# Patient Record
Sex: Male | Born: 1985 | Hispanic: No | Marital: Single | Smoking: Former smoker
Health system: Southern US, Community
[De-identification: ages and names within clinical notes are randomized; demographics above are authoritative.]

## PROBLEM LIST (undated history)

## (undated) HISTORY — PX: CHOLECYSTECTOMY: SHX55

---

## 2021-02-05 ENCOUNTER — Emergency Department: Payer: PRIVATE HEALTH INSURANCE

## 2021-02-05 ENCOUNTER — Ambulatory Visit
Admission: EM | Admit: 2021-02-05 | Discharge: 2021-02-05 | Disposition: A | Discharge status: Home / Self Care | Payer: PRIVATE HEALTH INSURANCE | Attending: Emergency Medicine | Admitting: Emergency Medicine

## 2021-02-05 ENCOUNTER — Emergency Department: Payer: PRIVATE HEALTH INSURANCE | Admitting: Anesthesiology

## 2021-02-05 ENCOUNTER — Encounter
Admission: EM | Disposition: A | Discharge status: Home / Self Care | Payer: Self-pay | Source: Home / Self Care | Attending: Emergency Medicine

## 2021-02-05 ENCOUNTER — Other Ambulatory Visit: Payer: Self-pay

## 2021-02-05 ENCOUNTER — Encounter: Payer: Self-pay | Admitting: Emergency Medicine

## 2021-02-05 DIAGNOSIS — R590 Localized enlarged lymph nodes: Secondary | ICD-10-CM | POA: Insufficient documentation

## 2021-02-05 DIAGNOSIS — Z20822 Contact with and (suspected) exposure to covid-19: Secondary | ICD-10-CM | POA: Insufficient documentation

## 2021-02-05 DIAGNOSIS — Z87891 Personal history of nicotine dependence: Secondary | ICD-10-CM | POA: Insufficient documentation

## 2021-02-05 DIAGNOSIS — J36 Peritonsillar abscess: Secondary | ICD-10-CM | POA: Insufficient documentation

## 2021-02-05 DIAGNOSIS — Z9049 Acquired absence of other specified parts of digestive tract: Secondary | ICD-10-CM | POA: Insufficient documentation

## 2021-02-05 HISTORY — PX: INCISION AND DRAINAGE OF PERITONSILLAR ABCESS: SHX6257

## 2021-02-05 LAB — LACTIC ACID, PLASMA
Lactic Acid, Venous: 1.6 mmol/L (ref 0.5–1.9)
Lactic Acid, Venous: 1.1 mmol/L (ref 0.5–1.9)

## 2021-02-05 LAB — APTT: aPTT: 31 seconds (ref 24–36)

## 2021-02-05 LAB — CBC WITH DIFFERENTIAL/PLATELET
Abs Immature Granulocytes: 0.12 10*3/uL — ABNORMAL HIGH (ref 0.00–0.07)
Basophils Absolute: 0.1 10*3/uL (ref 0.0–0.1)
Basophils Relative: 0 %
Eosinophils Absolute: 0.1 10*3/uL (ref 0.0–0.5)
Eosinophils Relative: 0 %
HCT: 45.6 % (ref 39.0–52.0)
Hemoglobin: 15.9 g/dL (ref 13.0–17.0)
Immature Granulocytes: 1 %
Lymphocytes Relative: 6 %
Lymphs Abs: 1.4 10*3/uL (ref 0.7–4.0)
MCH: 33.8 pg (ref 26.0–34.0)
MCHC: 34.9 g/dL (ref 30.0–36.0)
MCV: 97 fL (ref 80.0–100.0)
Monocytes Absolute: 1.7 10*3/uL — ABNORMAL HIGH (ref 0.1–1.0)
Monocytes Relative: 7 %
Neutro Abs: 19.8 10*3/uL — ABNORMAL HIGH (ref 1.7–7.7)
Neutrophils Relative %: 86 %
Platelets: 315 10*3/uL (ref 150–400)
RBC: 4.7 MIL/uL (ref 4.22–5.81)
RDW: 11.4 % — ABNORMAL LOW (ref 11.5–15.5)
WBC: 23.1 10*3/uL — ABNORMAL HIGH (ref 4.0–10.5)
nRBC: 0 % (ref 0.0–0.2)

## 2021-02-05 LAB — COMPREHENSIVE METABOLIC PANEL
ALT: 41 U/L (ref 0–44)
AST: 33 U/L (ref 15–41)
Albumin: 4.4 g/dL (ref 3.5–5.0)
Alkaline Phosphatase: 72 U/L (ref 38–126)
Anion gap: 12 (ref 5–15)
BUN: 11 mg/dL (ref 6–20)
CO2: 21 mmol/L — ABNORMAL LOW (ref 22–32)
Calcium: 9 mg/dL (ref 8.9–10.3)
Chloride: 102 mmol/L (ref 98–111)
Creatinine, Ser: 1.11 mg/dL (ref 0.61–1.24)
GFR, Estimated: >60 mL/min (ref >60)
Glucose, Bld: 162 mg/dL — ABNORMAL HIGH (ref 70–99)
Potassium: 3.7 mmol/L (ref 3.5–5.1)
Sodium: 135 mmol/L (ref 135–145)
Total Bilirubin: 1.7 mg/dL — ABNORMAL HIGH (ref 0.3–1.2)
Total Protein: 8.6 g/dL — ABNORMAL HIGH (ref 6.5–8.1)

## 2021-02-05 LAB — PROTIME-INR
INR: 1 (ref 0.8–1.2)
Prothrombin Time: 13.6 seconds (ref 11.4–15.2)

## 2021-02-05 LAB — RESP PANEL BY RT-PCR (FLU A&B, COVID) ARPGX2
Influenza A by PCR: NEGATIVE
Influenza B by PCR: NEGATIVE
SARS Coronavirus 2 by RT PCR: NEGATIVE

## 2021-02-05 LAB — GROUP A STREP BY PCR: Group A Strep by PCR: NOT DETECTED

## 2021-02-05 SURGERY — INCISION AND DRAINAGE, ABSCESS, PERITONSILLAR
Anesthesia: General

## 2021-02-05 MED ORDER — ROCURONIUM BROMIDE 100 MG/10ML IV SOLN
INTRAVENOUS | Status: DC | PRN
  Administered 2021-02-05: 20 mg via INTRAVENOUS

## 2021-02-05 MED ORDER — PROPOFOL 10 MG/ML IV BOLUS
INTRAVENOUS | Status: DC | PRN
  Administered 2021-02-05: 200 mg via INTRAVENOUS

## 2021-02-05 MED ORDER — SODIUM CHLORIDE 0.9 % IV BOLUS
1000.0000 mL | Freq: Once | INTRAVENOUS | Status: AC
  Administered 2021-02-05: 1000 mL via INTRAVENOUS

## 2021-02-05 MED ORDER — HYDROCODONE-ACETAMINOPHEN 7.5-325 MG/15ML PO SOLN: 15.0000 mL | ORAL | 0 refills | Status: AC | PRN

## 2021-02-05 MED ORDER — ONDANSETRON HCL 4 MG/2ML IJ SOLN
INTRAMUSCULAR | Status: DC | PRN
  Administered 2021-02-05: 4 mg via INTRAVENOUS

## 2021-02-05 MED ORDER — OXYCODONE HCL 5 MG/5ML PO SOLN
ORAL | Status: AC
  Filled 2021-02-05: qty 5

## 2021-02-05 MED ORDER — SILVER NITRATE-POT NITRATE 75-25 % EX MISC
CUTANEOUS | Status: AC
  Filled 2021-02-05: qty 40

## 2021-02-05 MED ORDER — AMOXICILLIN-POT CLAVULANATE 875-125 MG PO TABS: 1.0000 | ORAL_TABLET | Freq: Two times a day (BID) | ORAL | 0 refills | Status: AC

## 2021-02-05 MED ORDER — ACETAMINOPHEN 325 MG PO TABS
650.0000 mg | ORAL_TABLET | Freq: Once | ORAL | Status: AC | PRN
  Administered 2021-02-05: 650 mg via ORAL
  Filled 2021-02-05: qty 2

## 2021-02-05 MED ORDER — DEXAMETHASONE SODIUM PHOSPHATE 10 MG/ML IJ SOLN
8.0000 mg | Freq: Once | INTRAMUSCULAR | Status: AC
  Administered 2021-02-05: 8 mg via INTRAVENOUS
  Filled 2021-02-05: qty 1

## 2021-02-05 MED ORDER — MORPHINE SULFATE (PF) 4 MG/ML IV SOLN
4.0000 mg | Freq: Once | INTRAVENOUS | Status: AC
  Administered 2021-02-05: 4 mg via INTRAVENOUS
  Filled 2021-02-05: qty 1

## 2021-02-05 MED ORDER — ACETAMINOPHEN 10 MG/ML IV SOLN: 1000.0000 mg | Freq: Once | INTRAVENOUS | Status: DC | PRN

## 2021-02-05 MED ORDER — OXYCODONE HCL 5 MG PO TABS: 5.0000 mg | ORAL_TABLET | Freq: Once | ORAL | Status: AC | PRN

## 2021-02-05 MED ORDER — MIDAZOLAM HCL 2 MG/2ML IJ SOLN
INTRAMUSCULAR | Status: AC
  Filled 2021-02-05: qty 2

## 2021-02-05 MED ORDER — PROPOFOL 10 MG/ML IV BOLUS
INTRAVENOUS | Status: AC
  Filled 2021-02-05: qty 20

## 2021-02-05 MED ORDER — DEXAMETHASONE SODIUM PHOSPHATE 10 MG/ML IJ SOLN
INTRAMUSCULAR | Status: DC | PRN
  Administered 2021-02-05: 10 mg via INTRAVENOUS

## 2021-02-05 MED ORDER — SUCCINYLCHOLINE CHLORIDE 20 MG/ML IJ SOLN
INTRAMUSCULAR | Status: DC | PRN
  Administered 2021-02-05: 120 mg via INTRAVENOUS

## 2021-02-05 MED ORDER — ONDANSETRON HCL 4 MG/2ML IJ SOLN
4.0000 mg | Freq: Once | INTRAMUSCULAR | Status: AC
  Administered 2021-02-05: 4 mg via INTRAVENOUS
  Filled 2021-02-05: qty 2

## 2021-02-05 MED ORDER — FENTANYL CITRATE (PF) 100 MCG/2ML IJ SOLN
INTRAMUSCULAR | Status: AC
  Filled 2021-02-05: qty 2

## 2021-02-05 MED ORDER — SUGAMMADEX SODIUM 500 MG/5ML IV SOLN
INTRAVENOUS | Status: AC
  Filled 2021-02-05: qty 5

## 2021-02-05 MED ORDER — SUGAMMADEX SODIUM 200 MG/2ML IV SOLN
INTRAVENOUS | Status: DC | PRN
  Administered 2021-02-05: 400 mg via INTRAVENOUS

## 2021-02-05 MED ORDER — SODIUM CHLORIDE 0.9 % IV SOLN
3.0000 g | Freq: Once | INTRAVENOUS | Status: AC
  Administered 2021-02-05: 3 g via INTRAVENOUS
  Filled 2021-02-05: qty 8

## 2021-02-05 MED ORDER — SODIUM CHLORIDE 0.9 % IV SOLN: INTRAVENOUS | Status: DC | PRN

## 2021-02-05 MED ORDER — OXYCODONE HCL 5 MG/5ML PO SOLN
5.0000 mg | Freq: Once | ORAL | Status: AC | PRN
  Administered 2021-02-05: 5 mg via ORAL

## 2021-02-05 MED ORDER — IOHEXOL 300 MG/ML  SOLN
75.0000 mL | Freq: Once | INTRAMUSCULAR | Status: AC | PRN
  Administered 2021-02-05: 75 mL via INTRAVENOUS
  Filled 2021-02-05: qty 75

## 2021-02-05 MED ORDER — MIDAZOLAM HCL 2 MG/2ML IJ SOLN
INTRAMUSCULAR | Status: DC | PRN
  Administered 2021-02-05: 2 mg via INTRAVENOUS

## 2021-02-05 MED ORDER — FENTANYL CITRATE (PF) 100 MCG/2ML IJ SOLN
INTRAMUSCULAR | Status: DC | PRN
  Administered 2021-02-05: 50 ug via INTRAVENOUS

## 2021-02-05 MED ORDER — ONDANSETRON HCL 4 MG/2ML IJ SOLN: 4.0000 mg | Freq: Once | INTRAMUSCULAR | Status: DC | PRN

## 2021-02-05 MED ORDER — FENTANYL CITRATE (PF) 100 MCG/2ML IJ SOLN
25.0000 ug | INTRAMUSCULAR | Status: DC | PRN
Start: 2021-02-05 — End: 2021-02-06

## 2021-02-05 MED ORDER — LIDOCAINE HCL (CARDIAC) PF 100 MG/5ML IV SOSY
PREFILLED_SYRINGE | INTRAVENOUS | Status: DC | PRN
  Administered 2021-02-05: 100 mg via INTRAVENOUS

## 2021-02-05 SURGICAL SUPPLY — 11 items
CANISTER SUCT 1200ML W/VALVE (MISCELLANEOUS) IMPLANT
COAG SUCT 10F 3.5MM HAND CTRL (MISCELLANEOUS) ×2 IMPLANT
COVER WAND RF STERILE (DRAPES) ×2 IMPLANT
ELECT REM PT RETURN 9FT ADLT (ELECTROSURGICAL) ×2
ELECTRODE REM PT RTRN 9FT ADLT (ELECTROSURGICAL) ×1 IMPLANT
GLOVE PROTEXIS LATEX SZ 7.5 (GLOVE) ×6 IMPLANT
GOWN STRL REUS W/ TWL LRG LVL3 (GOWN DISPOSABLE) ×2 IMPLANT
GOWN STRL REUS W/TWL LRG LVL3 (GOWN DISPOSABLE) ×2
MANIFOLD NEPTUNE II (INSTRUMENTS) ×2 IMPLANT
PACK HEAD/NECK (MISCELLANEOUS) ×2 IMPLANT
SWAB CULTURE AMIES ANAERIB BLU (MISCELLANEOUS) ×2 IMPLANT

## 2021-02-05 NOTE — Anesthesia Preprocedure Evaluation (Signed)
Anesthesia Evaluation  Patient identified by MRN, date of birth, ID band Patient awake  General Assessment Comment:Peritonsillar abscess. CT shows narrowing of oropharynx without compllete obliteration.   Patient has not eaten for two days. Visibly distressed and in pain. Limited mouth opening and neck extension due to pain.  Reviewed: Allergy & Precautions, NPO status , Patient's Chart, lab work & pertinent test results  History of Anesthesia Complications Negative for: history of anesthetic complications  Airway Mallampati: III  TM Distance: >3 FB Neck ROM: Limited    Dental no notable dental hx. (+) Teeth Intact   Pulmonary neg pulmonary ROS, neg sleep apnea, neg COPD, Patient abstained from smoking.Not current smoker, former smoker,    Pulmonary exam normal breath sounds clear to auscultation       Cardiovascular Exercise Tolerance: Good METS(-) hypertension(-) CAD and (-) Past MI (-) dysrhythmias  Rhythm:Regular Rate:Normal - Systolic murmurs    Neuro/Psych negative neurological ROS  negative psych ROS   GI/Hepatic neg GERD  ,(+)     (-) substance abuse  ,   Endo/Other  neg diabetes  Renal/GU negative Renal ROS     Musculoskeletal   Abdominal   Peds  Hematology   Anesthesia Other Findings History reviewed. No pertinent past medical history.  Reproductive/Obstetrics                             Anesthesia Physical Anesthesia Plan  ASA: III and emergent  Anesthesia Plan: General   Post-op Pain Management:    Induction: Intravenous and Rapid sequence  PONV Risk Score and Plan: 3 and Ondansetron, Dexamethasone and Midazolam  Airway Management Planned: Oral ETT and Video Laryngoscope Planned  Additional Equipment: None  Intra-op Plan:   Post-operative Plan: Extubation in OR  Informed Consent: I have reviewed the patients History and Physical, chart, labs and discussed the  procedure including the risks, benefits and alternatives for the proposed anesthesia with the patient or authorized representative who has indicated his/her understanding and acceptance.     Dental advisory given  Plan Discussed with: CRNA and Surgeon  Anesthesia Plan Comments: (Discussed risks of anesthesia with patient, including PONV, sore throat, lip/dental damage, failed airway. Rare risks discussed as well, such as cardiorespiratory and neurological sequelae. I stressed the higher risk of the patient's airway, however it appears most of the airway limitations are due to pain rather than to physical obstruction/immobility. Will plan for asleep videolaryngoscopy, will have difficult airway equipment in room as well as ENT surgeon in the room. Patient understands.)        Anesthesia Quick Evaluation

## 2021-02-05 NOTE — H&P (Signed)
Todd Conner, Todd Conner 35 y.o., male [id number]     Chief Complaint: Acute sore throat  HPI: Patient is a 35 year old black male who presents with 3 days of worsening sore throat.  Not had a lot of tonsil problems in the past.  He has had fever body aches and worsening sore throat with swelling in his throat.  He has not complained of difficulty breathing but has a hard time opening his mouth and unable to swallow much.  He is able to swallow his own secretions but not eating anything currently.  He has not of a cough and he has not been on any medications for this at all.  Has had no recent travel.  TGY:BWLSLHT reviewed. No pertinent past medical history.  Surg Hx: Past Surgical History:  Procedure Laterality Date  . CHOLECYSTECTOMY      FHx:  No family history on file. SocHx:  reports that he has quit smoking. He has never used smokeless tobacco. He reports previous alcohol use. He reports previous drug use.  ALLERGIES: No Known Allergies  (Not in a hospital admission)   Results for orders placed or performed during the hospital encounter of 02/05/21 (from the past 48 hour(s))  Lactic acid, plasma     Status: None   Collection Time: 02/05/21 11:26 AM  Result Value Ref Range   Lactic Acid, Venous 1.6 0.5 - 1.9 mmol/L    Comment: Performed at [facility], [address], [place], [place] [address]  Comprehensive metabolic panel     Status: Abnormal   Collection Time: 02/05/21 11:26 AM  Result Value Ref Range   Sodium 135 135 - 145 mmol/L   Potassium 3.7 3.5 - 5.1 mmol/L   Chloride 102 98 - 111 mmol/L   CO2 21 (L) 22 - 32 mmol/L   Glucose, Bld 162 (H) 70 - 99 mg/dL    Comment: Glucose reference range applies only to samples taken after fasting for at least 8 hours.   BUN 11 6 - 20 mg/dL   Creatinine, Ser 6.81 0.61 - 1.24 mg/dL   Calcium 9.0 8.9 - 15.7 mg/dL   Total Protein 8.6 (H) 6.5 - 8.1 g/dL   Albumin 4.4 3.5 - 5.0 g/dL   AST 33 15 - 41 U/L   ALT 41 0 - 44 U/L    Alkaline Phosphatase 72 38 - 126 U/L   Total Bilirubin 1.7 (H) 0.3 - 1.2 mg/dL   GFR, Estimated >26 >20 mL/min    Comment: (NOTE) Calculated using the CKD-EPI Creatinine Equation (2021)    Anion gap 12 5 - 15    Comment: Performed at [facility], [address] [place], [place] [address]  CBC WITH DIFFERENTIAL     Status: Abnormal   Collection Time: 02/05/21 11:26 AM  Result Value Ref Range   WBC 23.1 (H) 4.0 - 10.5 K/uL   RBC 4.70 4.22 - 5.81 MIL/uL   Hemoglobin 15.9 13.0 - 17.0 g/dL   HCT 41.6 38.4 - 53.6 %   MCV 97.0 80.0 - 100.0 fL   MCH 33.8 26.0 - 34.0 pg   MCHC 34.9 30.0 - 36.0 g/dL   RDW 46.8 (L) 03.2 - 12.2 %   Platelets 315 150 - 400 K/uL   nRBC 0.0 0.0 - 0.2 %   Neutrophils Relative % 86 %   Neutro Abs 19.8 (H) 1.7 - 7.7 K/uL   Lymphocytes Relative 6 %   Lymphs Abs 1.4 0.7 - 4.0 K/uL  Monocytes Relative 7 %   Monocytes Absolute 1.7 (H) 0.1 - 1.0 K/uL   Eosinophils Relative 0 %   Eosinophils Absolute 0.1 0.0 - 0.5 K/uL   Basophils Relative 0 %   Basophils Absolute 0.1 0.0 - 0.1 K/uL   Immature Granulocytes 1 %   Abs Immature Granulocytes 0.12 (H) 0.00 - 0.07 K/uL    Comment: Performed at [facility], [address] [place], [place] [address]  Protime-INR     Status: None   Collection Time: 02/05/21 11:26 AM  Result Value Ref Range   Prothrombin Time 13.6 11.4 - 15.2 seconds   INR 1.0 0.8 - 1.2    Comment: (NOTE) INR goal varies based on device and disease states. Performed at [facility], [address] [place], [place] [address]   APTT     Status: None   Collection Time: 02/05/21 11:26 AM  Result Value Ref Range   aPTT 31 24 - 36 seconds    Comment: Performed at [facility], [address] [place], [place] [address]  Group A Strep by PCR [facility] Only)     Status: None   Collection Time: 02/05/21 11:26 AM   Specimen: Throat; Sterile Swab  Result Value Ref Range   Group A Strep by PCR NOT DETECTED NOT  DETECTED    Comment: Performed at [facility], [address] [place], [place] [address]  Lactic acid, plasma     Status: None   Collection Time: 02/05/21  1:21 PM  Result Value Ref Range   Lactic Acid, Venous 1.1 0.5 - 1.9 mmol/L    Comment: Performed at [facility] [address], [place], [place] [address]  Resp Panel by RT-PCR (Flu A&B, Covid) Nasopharyngeal Swab     Status: None   Collection Time: 02/05/21  1:21 PM   Specimen: Nasopharyngeal Swab; Nasopharyngeal(NP) swabs in vial transport medium  Result Value Ref Range   SARS Coronavirus 2 by RT PCR NEGATIVE NEGATIVE    Comment: (NOTE) SARS-CoV-2 target nucleic acids are NOT DETECTED.  The SARS-CoV-2 RNA is generally detectable in upper respiratory specimens during the acute phase of infection. The lowest concentration of SARS-CoV-2 viral copies this assay can detect is 138 copies/mL. A negative result does not preclude SARS-Cov-2 infection and should not be used as the sole basis for treatment or other patient management decisions. A negative result may occur with  improper specimen collection/handling, submission of specimen other than nasopharyngeal swab, presence of viral mutation(s) within the areas targeted by this assay, and inadequate number of viral copies(<138 copies/mL). A negative result must be combined with clinical observations, patient history, and epidemiological information. The expected result is Negative.  Fact Sheet for Patients:  BloggerCourse.com  Fact Sheet for Healthcare Providers:  SeriousBroker.it  This test is no t yet approved or cleared by the [place] FDA and  has been authorized for detection and/or diagnosis of SARS-CoV-2 by FDA under an Emergency Use Authorization (EUA). This EUA will remain  in effect (meaning this test can be used) for the duration of the COVID-19 declaration under Section 564(b)(1) of the Act,  21 U.S.C.section 360bbb-3(b)(1), unless the authorization is terminated  or revoked sooner.       Influenza A by PCR NEGATIVE NEGATIVE   Influenza B by PCR NEGATIVE NEGATIVE    Comment: (NOTE) The Xpert Xpress SARS-CoV-2/FLU/RSV plus assay is intended as an aid in the diagnosis of influenza from Nasopharyngeal swab specimens and should not  be used as a sole basis for treatment. Nasal washings and aspirates are unacceptable for Xpert Xpress SARS-CoV-2/FLU/RSV testing.  Fact Sheet for Patients: BloggerCourse.com  Fact Sheet for Healthcare Providers: SeriousBroker.it  This test is not yet approved or cleared by the [place] FDA and has been authorized for detection and/or diagnosis of SARS-CoV-2 by FDA under an Emergency Use Authorization (EUA). This EUA will remain in effect (meaning this test can be used) for the duration of the COVID-19 declaration under Section 564(b)(1) of the Act, 21 U.S.C. section 360bbb-3(b)(1), unless the authorization is terminated or revoked.  Performed at [facility], [address] [place], [place] [address]    CT Soft Tissue Neck W Contrast  Result Date: 02/05/2021 CLINICAL DATA:  Sore throat, fever EXAM: CT NECK WITH CONTRAST TECHNIQUE: Multidetector CT imaging of the neck was performed using the standard protocol following the bolus administration of intravenous contrast. CONTRAST:  75mL OMNIPAQUE IOHEXOL 300 MG/ML  SOLN COMPARISON:  None. FINDINGS: Pharynx and larynx: Enlargement of the adenoids. There is enlargement of the palatine tonsils. On the left, there is a fluid and air containing collection measuring approximately 3.9 x 2.7 x 3.6 cm. Inflammatory changes extend into the left piriform sinus. There is also asymmetric thickening of the left supraglottic larynx. Oropharyngeal airway is narrowed but patent. Left parapharyngeal and submandibular inflammatory fat infiltration and  edema. Posterior pharyngeal/retropharyngeal wall thickening/edema. Salivary glands: Parotid and submandibular glands are unremarkable. Thyroid: Normal. Lymph nodes: Likely reactive cervical lymph nodes. For example left level 2 node on image 45 measuring 2.2 cm. Vascular: Major neck vessels are patent. Limited intracranial: No abnormal enhancement. Visualized orbits: Unremarkable. Mastoids and visualized paranasal sinuses: Unremarkable. Skeleton: No acute osseous abnormality. Upper chest: Included upper lungs are clear. Other: None. IMPRESSION: Pharyngitis and tonsillitis with left peritonsillar abscess measuring 3.9 cm. Inflammatory changes extend to the left supraglottic larynx. There is posterior pharyngeal/retropharyngeal wall thickening/edema. Oropharyngeal airway is narrowed but patent. Reactive cervical adenopathy. Electronically Signed   By: Guadlupe Spanish M.D.   On: 02/05/2021 13:32    ROS: No other upper respiratory symptoms.  Hearing unchanged.  He has not hoarse.  He is able to swallow liquids okay but it just hurts when he swallows.  Blood pressure (!) 99/56, pulse 85, temperature 98.7 F (37.1 C), temperature source Oral, resp. rate 13, height 5\' 10"  (1.778 m), weight 114.8 kg, SpO2 94 %.  PHYSICAL EXAM: Overall appearance shows him to be well-developed and well-nourished in some discomfort. Head: Normocephalic Ears: Clear Nose: Open anteriorly Oral Cavity: Some trismus and he cannot open his mouth wide.  The palate is swollen both sides and uvula sits in the midline. Oral Pharynx/Hypopharynx/Larynx unable to see his posterior pharynx or larynx. Neuro: Alert and oriented x3 Neck: Tender left anterior neck although no significantly enlarged nodes.  Studies Reviewed: CT scan of his neck shows a left peritonsillar abscess that is 3.9 cm in size.    Assessment/Plan He has a left peritonsillar abscess that needs to be drained.  He is been started on Unasyn 3 g and given 8 mg of  Decadron.  He has enough trismus that he cannot get to this well through the mouth so we will plan to take him to the operating room and under general anesthesia drain the left peritonsillar abscess.  I discussed this at length with the patient and he understands that this is to get the pus out so that the infection will settle down.  We will not  be taken his tonsils out today.  Once his infection is resolved he may be a good candidate for removal of his tonsils at that time so he does not get further infections.  He has no further questions and has signed the informed surgical request form.  He has been n.p.o. all afternoon and on IV fluids.  We will plan to discharge him from the recovery room on pain medications at home to continue on oral Augmentin as well.  Beverly Sessionsaul H Taisha Pennebaker 02/05/2021, 6:08 PM

## 2021-02-05 NOTE — Op Note (Signed)
02/05/2021 8:47 PM    Todd Conner  [id number]   Pre-Op Dx: Left peritonsillar Abscess  Post-op Dx: same  Proc:  I&D left PTA  Surg:  Beverly Sessions Lanna Labella  Anes:  GOT  EBL: 20 mL  Comp: None  Findings: Tense left soft palate with large left peritonsillar abscess.  His left tonsil was pushing across midline and had an exudate on it.  When I made the small incision with electrocautery into the left peritonsillar area the pus shot out under pressure to the roof of the mouth.  It has a very foul smell to it.  Cultures were taken.  The entire abscess was suctioned out and then flushed with saline.  Bleeding was cauterized along the incision line and there is no significant bleeding within the abscess.  Patient has terrible dental status with multiple caries on many of his upper teeth.  Procedure: With the patient in a comfortable supine position, GOT was administered in standard fashion.    At an appropriate level,  the table was turned 90 degrees away from anesthesia  and placed in Trendelenberg position. Routine clean preparation and draping was performed. Taking care to protect lips, teeth and endotracheal tube, the Crowe-Davis mouth gag was introduced, expanded for visualization, and suspended from the Mayo stand in the standard fashion.  The findings were as described as above.  Electrocautery was used to perform a crescent incision above the tonsil.  This was carried down on the capsule of the tonsil.  A frank abscess cavity was encountered, and widely opened.  A culture was obtained.   Hemostasis was spontaneous.  The cavity was irrigated with sterile saline.  The mouth gag was relaxed for several minutes.  Upon re-expansion, hemostasis was observed.  At this point the procedure was completed.  The mouth gag was relaxed and removed.  The dental status was  Intact.  The patient was returned to Anesthesia, awakened, extubated, and transferred to PACU in stable condition.    Dispo:   PACU  to Home  Plan:  Hydration, antibiosis, analgesia.   Given low anticipated risk of post-anesthetic or post-surgical complications, feel an outpatient venue is appropriate.  I have written prescriptions for Augmentin and Hycet liquid.  He will follow-up in the office in about 2 weeks to reevaluate his throat and consider scheduling a tonsillectomy for the future.  Beverly Sessions Rollan Roger 02/05/2021 8:47 PM

## 2021-02-05 NOTE — ED Triage Notes (Signed)
First Nurse Note:  C/O sore throat and difficulty breathing x 2 days.  No SOB/ DOE.  Voice clear and strong.  Controlling oral secretions. NAD

## 2021-02-05 NOTE — Anesthesia Postprocedure Evaluation (Signed)
Anesthesia Post Note  Patient: Todd Conner  Procedure(s) Performed: INCISION AND DRAINAGE OF PERITONSILLAR ABCESS (N/A )  Patient location during evaluation: PACU Anesthesia Type: General Level of consciousness: awake and alert Pain management: pain level controlled Vital Signs Assessment: post-procedure vital signs reviewed and stable Respiratory status: spontaneous breathing, nonlabored ventilation, respiratory function stable and patient connected to nasal cannula oxygen Cardiovascular status: blood pressure returned to baseline and stable Postop Assessment: no apparent nausea or vomiting Anesthetic complications: no   No complications documented.   Last Vitals:  Vitals:   02/05/21 2200 02/05/21 2210  BP: 127/73 120/74  Pulse: 92 93  Resp: 15   Temp:    SpO2: 97% 97%    Last Pain:  Vitals:   02/05/21 2120  TempSrc:   PainSc: 0-No pain                 Corinda Gubler

## 2021-02-05 NOTE — Transfer of Care (Signed)
Immediate Anesthesia Transfer of Care Note  Patient: Todd Conner  Procedure(s) Performed: INCISION AND DRAINAGE OF PERITONSILLAR ABCESS (N/A )  Patient Location: PACU  Anesthesia Type:General  Level of Consciousness: sedated and patient cooperative  Airway & Oxygen Therapy: Patient Spontanous Breathing  Post-op Assessment: Report given to RN and Post -op Vital signs reviewed and stable  Post vital signs: Reviewed and stable  Last Vitals:  Vitals Value Taken Time  BP    Temp    Pulse 101 02/05/21 2104  Resp 16 02/05/21 2104  SpO2 97 % 02/05/21 2104  Vitals shown include unvalidated device data.  Last Pain:  Vitals:   02/05/21 1517  TempSrc:   PainSc: 6          Complications: No complications documented.

## 2021-02-05 NOTE — Anesthesia Procedure Notes (Addendum)
Procedure Name: Intubation Date/Time: 02/05/2021 8:37 PM Performed by: Corinda Gubler, MD Pre-anesthesia Checklist: Patient identified, Patient being monitored, Timeout performed, Emergency Drugs available and Suction available Patient Re-evaluated:Patient Re-evaluated prior to induction Oxygen Delivery Method: Circle system utilized Preoxygenation: Pre-oxygenation with 100% oxygen Induction Type: IV induction and Rapid sequence Laryngoscope Size: 3 and McGraph Grade View: Grade III Tube type: Oral Tube size: 7.0 mm Number of attempts: 2 Airway Equipment and Method: Stylet Placement Confirmation: ETT inserted through vocal cords under direct vision,  positive ETCO2 and breath sounds checked- equal and bilateral Secured at: 23 cm Tube secured with: Tape Dental Injury: Teeth and Oropharynx as per pre-operative assessment  Difficulty Due To: Difficulty was anticipated and Difficult Airway-  due to edematous airway Comments: Uneventful first intubation, however cuff ended up popping soon after placement, and was replaced with another 6.0 ETT uneventfully. Edematous oropharynx and epiglottis.

## 2021-02-05 NOTE — ED Notes (Signed)
Says 2 days sore throat, wth fever.  Says h urts to swollow and hurts to talk.

## 2021-02-05 NOTE — ED Provider Notes (Signed)
[facility] Emergency Department Provider Note   ____________________________________________    I have reviewed the triage vital signs and the nursing notes.   HISTORY  Chief Complaint Sore Throat     HPI Todd Conner is a 35 y.o. male who presents with 3 days of worsening sore throat.  Patient describes fever, body aches, worsening sore throat with swelling.  Denies difficulty breathing to me but swallowing is quite painful.  Is tolerating secretions.  No sick contacts reported.  No cough.  Has not take anything for this.  No recent travel.  History reviewed. No pertinent past medical history.  There are no problems to display for this patient.   Past Surgical History:  Procedure Laterality Date  . CHOLECYSTECTOMY      Prior to Admission medications   Not on File     Allergies Patient has no known allergies.  No family history on file.  Social History Social History   Tobacco Use  . Smoking status: Former Games developer  . Smokeless tobacco: Never Used  Substance Use Topics  . Alcohol use: Not Currently  . Drug use: Not Currently    Review of Systems  Constitutional: As above Eyes: No visual changes.  ENT: As above Cardiovascular: Denies chest pain. Respiratory: Denies shortness of breath. Gastrointestinal: No abdominal pain.  No nausea, no vomiting.   Genitourinary: Negative for dysuria. Musculoskeletal: Negative for back pain.  Positive myalgias  skin: Negative for rash. Neurological: Negative for headaches or weakness   ____________________________________________   PHYSICAL EXAM:  VITAL SIGNS: ED Triage Vitals  Enc Vitals Group     BP 02/05/21 1117 (!) 135/91     Pulse Rate 02/05/21 1117 (!) 126     Resp 02/05/21 1117 20     Temp 02/05/21 1117 (!) 100.6 F (38.1 C)     Temp Source 02/05/21 1117 Oral     SpO2 02/05/21 1117 97 %     Weight 02/05/21 1117 114.8 kg (253 lb)     Height 02/05/21 1117 1.778 m (5'  10")     Head Circumference --      Peak Flow --      Pain Score 02/05/21 1116 10     Pain Loc --      Pain Edu? --      Excl. in GC? --     Constitutional: Alert and oriented.   Nose: No congestion/rhinnorhea. Mouth/Throat: Mucous membranes are moist.  Pharynx, enlarged tonsils, no clear peritonsillar abscess, uvula normal, erythematous diffusely Neck: Positive anterior lymphadenopathy Cardiovascular: Normal rate, regular rhythm. Grossly normal heart sounds.  Good peripheral circulation. Respiratory: Normal respiratory effort.  No retractions. Lungs CTAB. Gastrointestinal: Soft and nontender. No distention.  No CVA tenderness.  Musculoskeletal: No lower extremity tenderness nor edema.  Warm and well perfused Neurologic:  Normal speech and language. No gross focal neurologic deficits are appreciated.  Skin:  Skin is warm, dry and intact. No rash noted. Psychiatric: Mood and affect are normal. Speech and behavior are normal.  ____________________________________________   LABS (all labs ordered are listed, but only abnormal results are displayed)  Labs Reviewed  COMPREHENSIVE METABOLIC PANEL - Abnormal; Notable for the following components:      Result Value   CO2 21 (*)    Glucose, Bld 162 (*)    Total Protein 8.6 (*)    Total Bilirubin 1.7 (*)    All other components within normal limits  CBC WITH DIFFERENTIAL/PLATELET - Abnormal; Notable for  the following components:   WBC 23.1 (*)    RDW 11.4 (*)    Neutro Abs 19.8 (*)    Monocytes Absolute 1.7 (*)    Abs Immature Granulocytes 0.12 (*)    All other components within normal limits  GROUP A STREP BY PCR  RESP PANEL BY RT-PCR (FLU A&B, COVID) [id number]  CULTURE, BLOOD (SINGLE)  LACTIC ACID, PLASMA  LACTIC ACID, PLASMA  PROTIME-INR  APTT   ____________________________________________  EKG  None ____________________________________________  RADIOLOGY  CT soft tissue  neck ____________________________________________   PROCEDURES  Procedure(s) performed: No  Procedures   Critical Care performed: No ____________________________________________   INITIAL IMPRESSION / ASSESSMENT AND PLAN / ED COURSE  Pertinent labs & imaging results that were available during my care of the patient were reviewed by me and considered in my medical decision making (see chart for details).  Patient presents with sore throat x3 days, febrile here, very mild tachycardia likely related to elevated temperature.  White blood cell count is elevated however lactic acid is normal.  Strep swab is negative, COVID swab pending.  Difficult exam will send for CT with IV contrast of the neck to evaluate for possible abscess  CT scan is consistent with 4 cm PTA, discussed with Dr. Elenore Rota of ENT, request the patient be kept n.p.o., IV steroids, IV antibiotics, to OR this afternoon  Discussed this with patient and he agrees with this plan   ____________________________________________   FINAL CLINICAL IMPRESSION(S) / ED DIAGNOSES  Final diagnoses:  Peritonsillar abscess        Note:  This document was prepared using Dragon voice recognition software and may include unintentional dictation errors.   Jene Every, MD 02/05/21 [phone]

## 2021-02-05 NOTE — Discharge Instructions (Signed)

## 2021-02-05 NOTE — ED Triage Notes (Signed)
Pt to ED via POV with c/o sore throat and fatigue x 2 days. Pt states has recently been out of town. Pt also c/o fevers and chills. Pt noted to be febrile on arrival to ED.   Pt with noted swelling to L side of his neck. Pt noted to be able to maintain his own secretions in triage.

## 2021-02-06 ENCOUNTER — Encounter: Payer: Self-pay | Admitting: Otolaryngology

## 2021-02-10 LAB — AEROBIC/ANAEROBIC CULTURE W GRAM STAIN (SURGICAL/DEEP WOUND)

## 2021-02-10 LAB — CULTURE, BLOOD (SINGLE)
Culture: NO GROWTH
Special Requests: ADEQUATE

## 2021-12-20 IMAGING — CT CT NECK W/ CM
3 of 4 series · 12 of 33 positions shown, 14 images · IV contrast (omnipaque)
Comparison: None.

CLINICAL DATA: Sore throat, fever

EXAM:
CT NECK WITH CONTRAST
TECHNIQUE: Multidetector CT imaging of the neck was performed using the
standard protocol following the bolus administration of intravenous
contrast.
CONTRAST:  75mL OMNIPAQUE IOHEXOL 300 MG/ML  SOLN

[Series 5: sag neck · sagittal · 0.53mm/px · 5 of 101 slices shown, 6 images]
[im 34/101  bone]
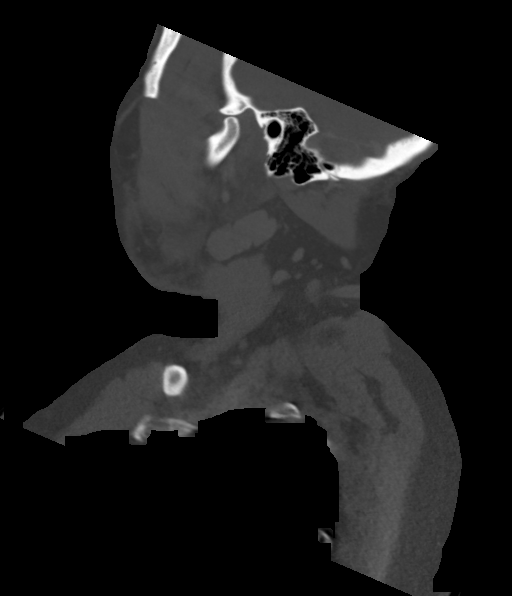
[im 42/101  bone]
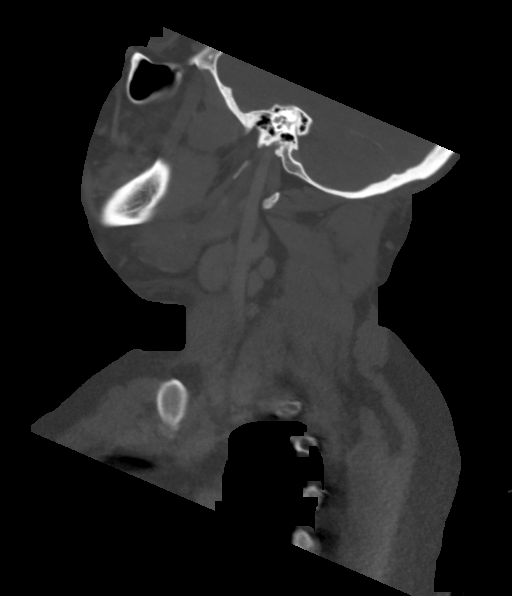
[im 51/101  soft-tissue]
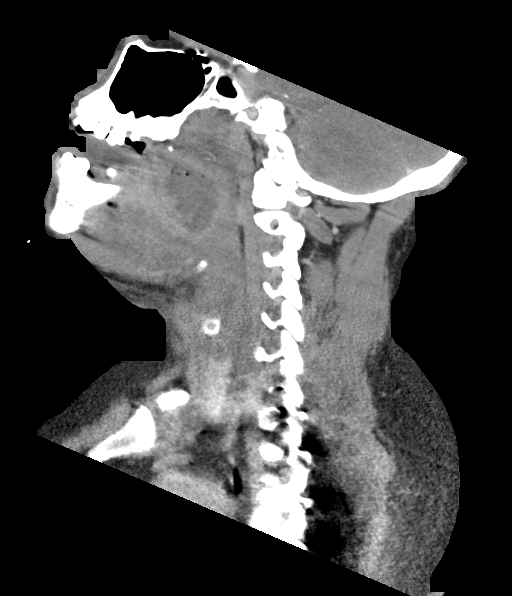
[im 51/101  bone]
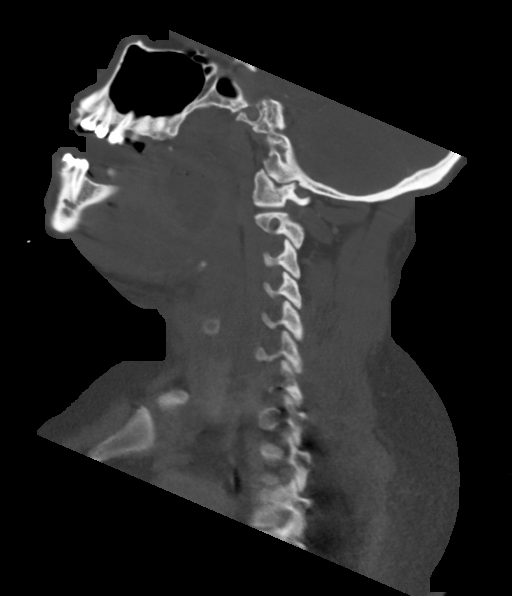
[im 59/101  bone]
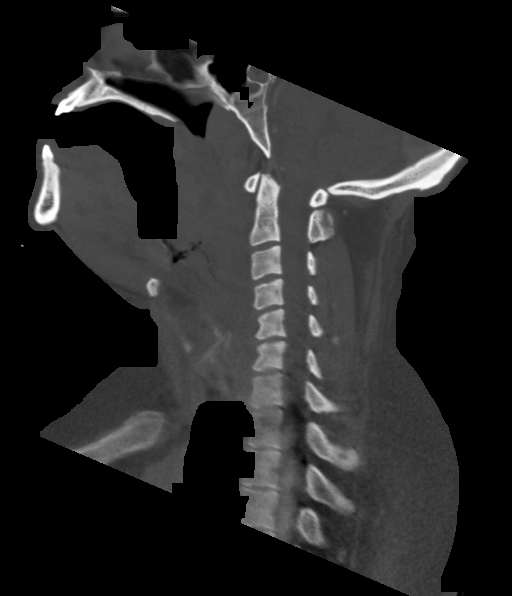
[im 67/101  bone]
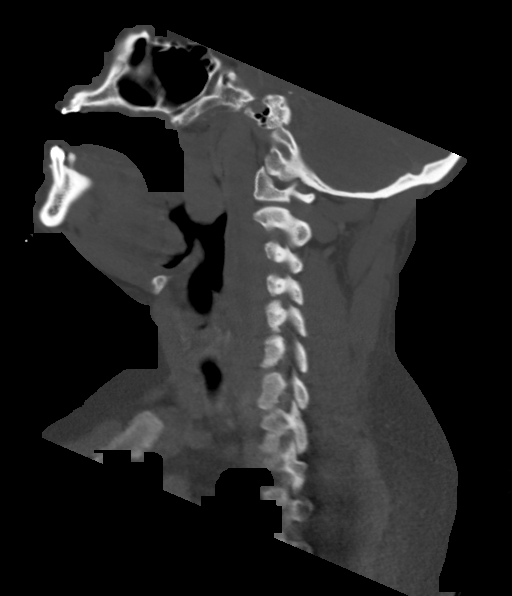

[Series 6: cor neck · coronal · 0.39mm/px · 3 of 135 slices shown]
[im 49/135  bone]
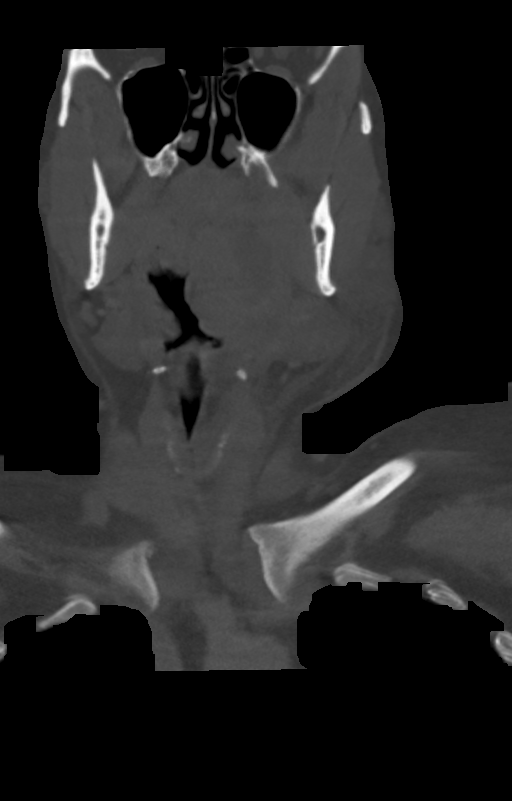
[im 61/135  bone]
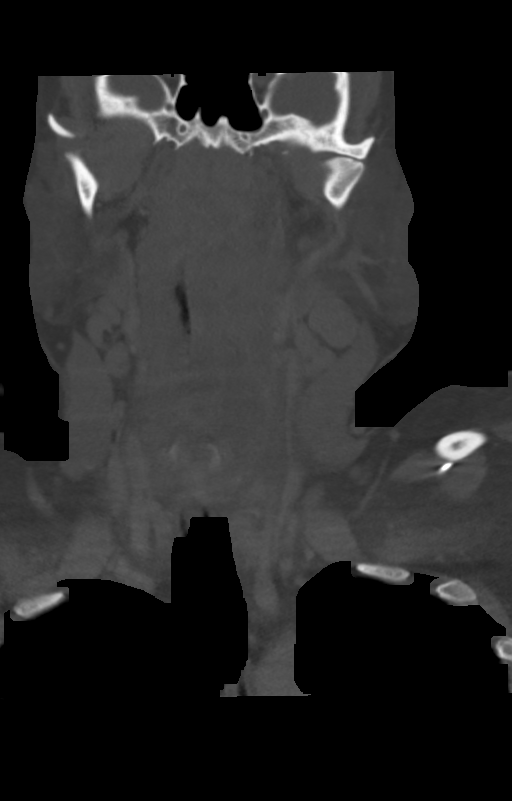
[im 74/135  bone]
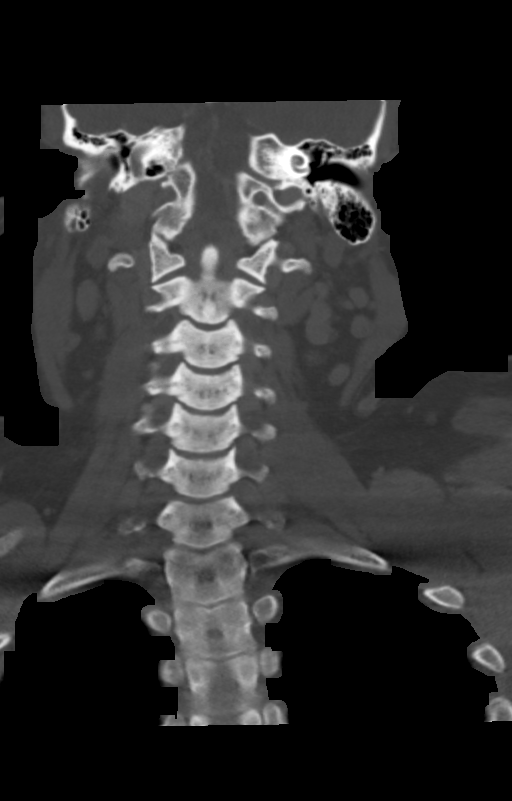

[Series 7: orthogonal ax · axial · 0.39mm/px · z∈[-348,-144]mm · 4 of 157 slices shown, 5 images]
[im 23/157  soft-tissue]
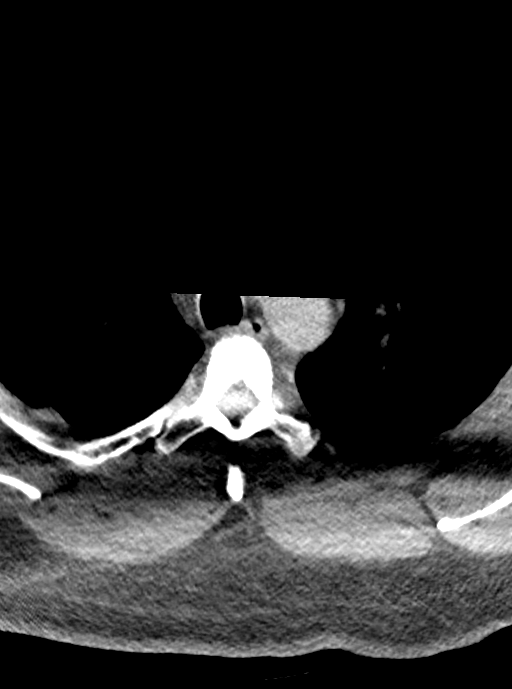
[im 23/157  bone]
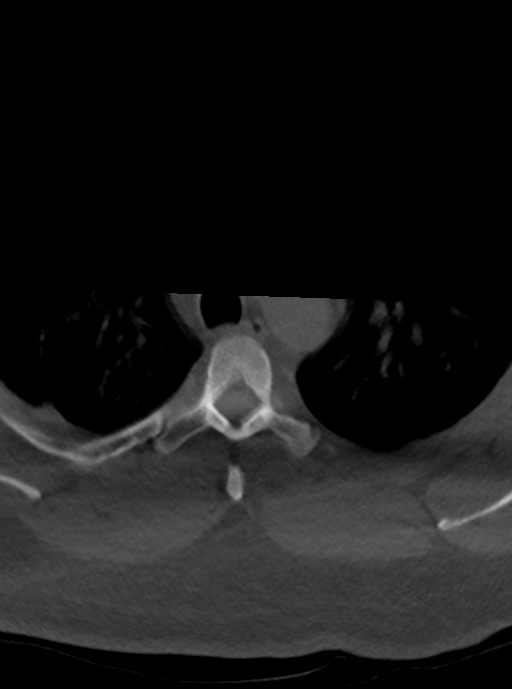
[im 67/157  bone]
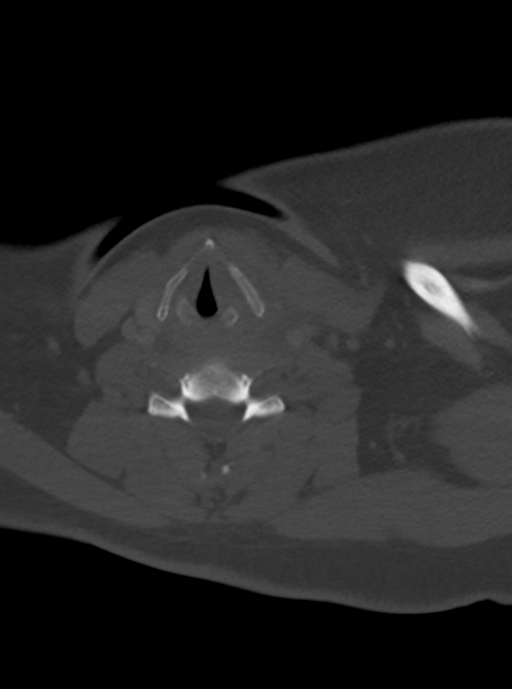
[im 90/157  bone]
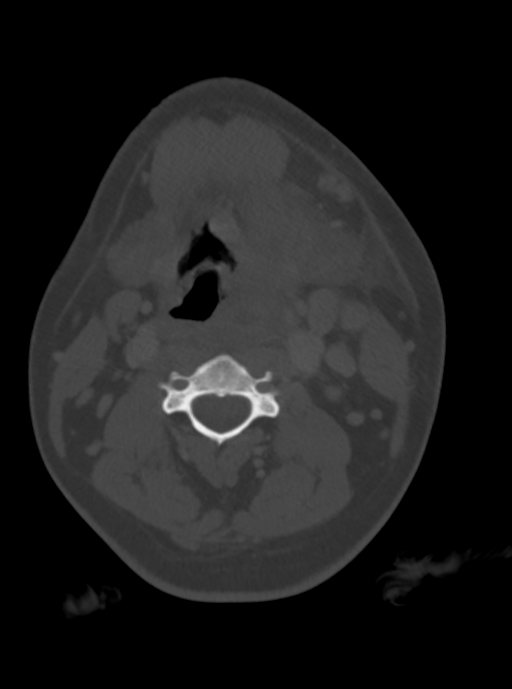
[im 134/157  bone]
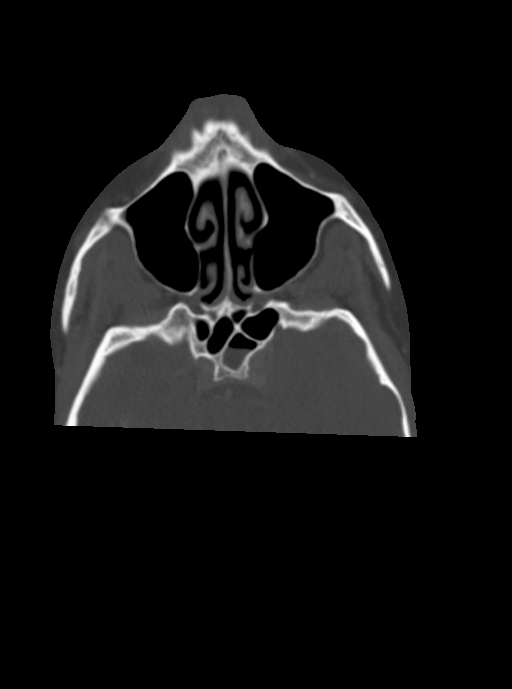

[12 of 33 positions shown; findings below may reference images not displayed]

FINDINGS: Pharynx and larynx: Enlargement of the adenoids. There is
enlargement of the palatine tonsils. On the left, there is a fluid
and air containing collection measuring approximately 3.9 x 2.7 x
3.6 cm. Inflammatory changes extend into the left piriform sinus.
There is also asymmetric thickening of the left supraglottic larynx.
Oropharyngeal airway is narrowed but patent. Left parapharyngeal and
submandibular inflammatory fat infiltration and edema. Posterior
pharyngeal/retropharyngeal wall thickening/edema.

Salivary glands: Parotid and submandibular glands are unremarkable.

Thyroid: Normal.

Lymph nodes: Likely reactive cervical lymph nodes. For example left
level 2 node on image 45 measuring 2.2 cm.

Vascular: Major neck vessels are patent.

Limited intracranial: No abnormal enhancement.

Visualized orbits: Unremarkable.

Mastoids and visualized paranasal sinuses: Unremarkable.

Skeleton: No acute osseous abnormality.

Upper chest: Included upper lungs are clear.

Other: None.
IMPRESSION: Pharyngitis and tonsillitis with left peritonsillar abscess
measuring 3.9 cm. Inflammatory changes extend to the left
supraglottic larynx. There is posterior pharyngeal/retropharyngeal
wall thickening/edema. Oropharyngeal airway is narrowed but patent.

Reactive cervical adenopathy.

## 2022-05-03 DEATH — deceased
